# Patient Record
Sex: Female | Born: 1975 | Race: White | Hispanic: No | Smoking: Current every day smoker
Health system: Southern US, Community
[De-identification: ages and names within clinical notes are randomized; demographics above are authoritative.]

## PROBLEM LIST (undated history)

## (undated) DIAGNOSIS — I10 Essential (primary) hypertension: Secondary | ICD-10-CM

## (undated) DIAGNOSIS — G7100 Muscular dystrophy, unspecified: Secondary | ICD-10-CM

## (undated) DIAGNOSIS — F419 Anxiety disorder, unspecified: Secondary | ICD-10-CM

## (undated) DIAGNOSIS — E0590 Thyrotoxicosis, unspecified without thyrotoxic crisis or storm: Secondary | ICD-10-CM

## (undated) HISTORY — PX: OTHER SURGICAL HISTORY: SHX169

---

## 2012-01-24 DIAGNOSIS — E039 Hypothyroidism, unspecified: Secondary | ICD-10-CM | POA: Insufficient documentation

## 2012-01-24 DIAGNOSIS — E669 Obesity, unspecified: Secondary | ICD-10-CM | POA: Insufficient documentation

## 2012-01-24 DIAGNOSIS — M7660 Achilles tendinitis, unspecified leg: Secondary | ICD-10-CM | POA: Insufficient documentation

## 2012-01-24 DIAGNOSIS — R600 Localized edema: Secondary | ICD-10-CM | POA: Insufficient documentation

## 2012-01-24 DIAGNOSIS — G7109 Other specified muscular dystrophies: Secondary | ICD-10-CM | POA: Insufficient documentation

## 2012-01-24 DIAGNOSIS — F411 Generalized anxiety disorder: Secondary | ICD-10-CM | POA: Insufficient documentation

## 2012-02-10 DIAGNOSIS — I10 Essential (primary) hypertension: Secondary | ICD-10-CM | POA: Insufficient documentation

## 2014-10-27 DIAGNOSIS — M79606 Pain in leg, unspecified: Secondary | ICD-10-CM | POA: Insufficient documentation

## 2015-01-12 DIAGNOSIS — Z9181 History of falling: Secondary | ICD-10-CM | POA: Insufficient documentation

## 2016-10-29 DIAGNOSIS — L237 Allergic contact dermatitis due to plants, except food: Secondary | ICD-10-CM | POA: Insufficient documentation

## 2017-03-10 ENCOUNTER — Emergency Department
Admission: EM | Admit: 2017-03-10 | Discharge: 2017-03-11 | Disposition: A | Discharge status: Home / Self Care | Payer: Self-pay | Attending: Emergency Medicine | Admitting: Emergency Medicine

## 2017-03-10 ENCOUNTER — Emergency Department: Payer: Self-pay

## 2017-03-10 ENCOUNTER — Encounter: Payer: Self-pay | Admitting: Emergency Medicine

## 2017-03-10 DIAGNOSIS — F17200 Nicotine dependence, unspecified, uncomplicated: Secondary | ICD-10-CM | POA: Insufficient documentation

## 2017-03-10 DIAGNOSIS — Z880 Allergy status to penicillin: Secondary | ICD-10-CM | POA: Insufficient documentation

## 2017-03-10 DIAGNOSIS — R079 Chest pain, unspecified: Secondary | ICD-10-CM | POA: Insufficient documentation

## 2017-03-10 DIAGNOSIS — F419 Anxiety disorder, unspecified: Secondary | ICD-10-CM | POA: Insufficient documentation

## 2017-03-10 DIAGNOSIS — R0789 Other chest pain: Secondary | ICD-10-CM | POA: Insufficient documentation

## 2017-03-10 DIAGNOSIS — E0590 Thyrotoxicosis, unspecified without thyrotoxic crisis or storm: Secondary | ICD-10-CM | POA: Insufficient documentation

## 2017-03-10 DIAGNOSIS — I4581 Long QT syndrome: Secondary | ICD-10-CM | POA: Insufficient documentation

## 2017-03-10 DIAGNOSIS — I10 Essential (primary) hypertension: Secondary | ICD-10-CM | POA: Insufficient documentation

## 2017-03-10 HISTORY — DX: Thyrotoxicosis, unspecified without thyrotoxic crisis or storm: E05.90

## 2017-03-10 HISTORY — DX: Essential (primary) hypertension: I10

## 2017-03-10 HISTORY — DX: Muscular dystrophy, unspecified: G71.00

## 2017-03-10 HISTORY — DX: Anxiety disorder, unspecified: F41.9

## 2017-03-10 LAB — BASIC METABOLIC PANEL
Anion gap: 16 — ABNORMAL HIGH (ref 5–15)
BUN: 9 mg/dL (ref 6–20)
CO2: 20 mmol/L — ABNORMAL LOW (ref 22–32)
CREATININE: 0.74 mg/dL (ref 0.44–1.00)
Calcium: 10.2 mg/dL (ref 8.9–10.3)
Chloride: 99 mmol/L — ABNORMAL LOW (ref 101–111)
GFR calc non Af Amer: >60 mL/min (ref >60)
Glucose, Bld: 124 mg/dL — ABNORMAL HIGH (ref 65–99)
Potassium: 3.7 mmol/L (ref 3.5–5.1)
SODIUM: 135 mmol/L (ref 135–145)

## 2017-03-10 LAB — CBC
HCT: 42.8 % (ref 35.0–47.0)
Hemoglobin: 14.3 g/dL (ref 12.0–16.0)
MCH: 27.2 pg (ref 26.0–34.0)
MCHC: 33.3 g/dL (ref 32.0–36.0)
MCV: 81.7 fL (ref 80.0–100.0)
PLATELETS: 358 10*3/uL (ref 150–440)
RBC: 5.24 MIL/uL — AB (ref 3.80–5.20)
RDW: 15.4 % — AB (ref 11.5–14.5)
WBC: 14.2 10*3/uL — AB (ref 3.6–11.0)

## 2017-03-10 LAB — TROPONIN I

## 2017-03-10 LAB — FIBRIN DERIVATIVES D-DIMER (ARMC ONLY): FIBRIN DERIVATIVES D-DIMER (ARMC): 668.09 ng{FEU}/mL — AB (ref 0.00–499.00)

## 2017-03-10 LAB — POCT PREGNANCY, URINE: PREG TEST UR: NEGATIVE

## 2017-03-10 MED ORDER — LORAZEPAM 0.5 MG PO TABS: 0.5000 mg | ORAL_TABLET | Freq: Three times a day (TID) | ORAL | 0 refills | Status: AC | PRN

## 2017-03-10 MED ORDER — IOPAMIDOL (ISOVUE-370) INJECTION 76%
100.0000 mL | Freq: Once | INTRAVENOUS | Status: AC | PRN
  Administered 2017-03-10: 100 mL via INTRAVENOUS

## 2017-03-10 MED ORDER — LORAZEPAM 2 MG/ML IJ SOLN
0.5000 mg | Freq: Once | INTRAMUSCULAR | Status: AC
  Administered 2017-03-10: 0.5 mg via INTRAVENOUS
  Filled 2017-03-10: qty 1

## 2017-03-10 NOTE — ED Notes (Signed)
Patient transported to CT 

## 2017-03-10 NOTE — Discharge Instructions (Signed)

## 2017-03-10 NOTE — ED Provider Notes (Signed)
[facility] Emergency Department Provider Note   ____________________________________________   First MD Initiated Contact with Patient 03/10/17 1920     (approximate)  I have reviewed the triage vital signs and the nursing notes.   HISTORY  Chief Complaint Chest Pain    HPI Summer Moore is a 41 y.o. female here for evaluation of chest discomfort  Patient began feeling slightly uncomfortable in her chest today.  She reports she started feeling very anxious, thought she was having a panic attack and began feeling short of breath with tingling and numbness in her hands and tremors.  Felt cramping in her hands.  She reports that she feels very anxious and concerned, but is trying to convince her self she is having a panic attack.  She does have a history of muscular dystrophy and has chronic weakness in her lower legs which is unchanged.  Denies trouble breathing, but does feel short of breath.  No history of any blood clots.  No recent hospitalizations.  No long trips or travel.  No known leg swelling.  No history of DVTs personally or in her family.  She denies history of early onset cardiac disease in her family, no known heart attacks in young family members.  She does smoke occasionally.  No known history of high cholesterol  Past Medical History:  Diagnosis Date  . Anxiety   . Hypertension   . Hyperthyroidism   . Muscular dystrophy     There are no active problems to display for this patient.   Past Surgical History:  Procedure Laterality Date  . achiles lengthening    . heel core lengthening      Prior to Admission medications   Medication Sig Start Date End Date Taking? Authorizing Provider  LORazepam (ATIVAN) 0.5 MG tablet Take 1 tablet (0.5 mg total) by mouth every 8 (eight) hours as needed for anxiety. 03/10/17 03/11/17  Sharyn Creamer, MD    Allergies Erythromycin; Penicillins; and Tetracyclines & related  History reviewed. No  pertinent family history.  Social History Social History  Substance Use Topics  . Smoking status: Current Every Day Smoker  . Smokeless tobacco: Never Used  . Alcohol use Yes    Review of Systems Constitutional: No fever/chills Eyes: No visual changes. ENT: No sore throat. Cardiovascular: Describes a heart to describe feeling of slight tightness across her upper chest Respiratory: See HPI.  No cough, no productive cough, no wheezing. Gastrointestinal: No abdominal pain.  No nausea, no vomiting.  No diarrhea.  No constipation. Genitourinary: Negative for dysuria.  Denies pregnancy. Musculoskeletal: Negative for back pain. Skin: Negative for rash. Neurological: Negative for headaches, focal weakness or numbness.    ____________________________________________   PHYSICAL EXAM:  VITAL SIGNS: ED Triage Vitals  Enc Vitals Group     BP 03/10/17 1731 128/75     Pulse Rate 03/10/17 1731 92     Resp 03/10/17 1731 (!) 22     Temp 03/10/17 1731 98.2 F (36.8 C)     Temp Source 03/10/17 1731 Oral     SpO2 03/10/17 1731 100 %     Weight 03/10/17 1732 260 lb (117.9 kg)     Height 03/10/17 1732 5\' 11"  (1.803 m)     Head Circumference --      Peak Flow --      Pain Score 03/10/17 1731 8     Pain Loc --      Pain Edu? --      Excl.  in GC? --     Constitutional: Alert and oriented.  Anxious and slightly tachypneic, but in no acute distress. Eyes: Conjunctivae are normal. Head: Atraumatic. Nose: No congestion/rhinnorhea. Mouth/Throat: Mucous membranes are moist. Neck: No stridor.   Cardiovascular: Normal rate, regular rhythm. Grossly normal heart sounds.  Good peripheral circulation. Respiratory: Normal respiratory effort except for some slight tachypnea.  No retractions. Lungs CTAB.  Speaks in full and clear sentences. Gastrointestinal: Soft and nontender. No distention. Musculoskeletal: No lower extremity tenderness nor edema.  Has 5 out of 5 strength in the lower extremities,  but seems slightly reduced compared to a normal examination as she reports chronic weakness.  No lower extremity edema.  No venous stasis noted. Neurologic:  Normal speech and language. No gross focal neurologic deficits are appreciated.  Skin:  Skin is warm, dry and intact. No rash noted. Psychiatric: Mood and affect are normal. Speech and behavior are normal.  ____________________________________________   LABS (all labs ordered are listed, but only abnormal results are displayed)  Labs Reviewed  BASIC METABOLIC PANEL - Abnormal; Notable for the following:       Result Value   Chloride 99 (*)    CO2 20 (*)    Glucose, Bld 124 (*)    Anion gap 16 (*)    All other components within normal limits  CBC - Abnormal; Notable for the following:    WBC 14.2 (*)    RBC 5.24 (*)    RDW 15.4 (*)    All other components within normal limits  BLOOD GAS, VENOUS - Abnormal; Notable for the following:    pH, Ven 7.47 (*)    pCO2, Ven 34 (*)    All other components within normal limits  FIBRIN DERIVATIVES D-DIMER ([facility] ONLY) - Abnormal; Notable for the following:    Fibrin derivatives D-dimer ([facility]) 668.09 (*)    All other components within normal limits  TROPONIN I  TROPONIN I  POC URINE PREG, ED  POCT PREGNANCY, URINE   ____________________________________________  EKG  Reviewed and interpreted by me at 1730 Heart rate 95 QRS 75 QTC 540 Sinus rhythm with slightly short PR intervals and prolonged QT ____________________________________________  RADIOLOGY  Dg Chest 2 View  Result Date: 03/10/2017 CLINICAL DATA:  Chest pain.  Shortness of breath. EXAM: CHEST  2 VIEW COMPARISON:  None. FINDINGS: Poor inspiration. Normal sized heart. Mild diffuse peribronchial thickening. Thoracic spine degenerative changes. IMPRESSION: Mild bronchitic changes. Electronically Signed   By: Beckie Salts M.D.   On: 03/10/2017 18:16    Chest x-ray mild bronchitic changes.  Reviewed by me.  Patient  denies any cough or infectious symptoms.  She is a smoker ____________________________________________   PROCEDURES  Procedure(s) performed: None  Procedures  Critical Care performed: No  ____________________________________________   INITIAL IMPRESSION / ASSESSMENT AND PLAN / ED COURSE  Pertinent labs & imaging results that were available during my care of the patient were reviewed by me and considered in my medical decision making (see chart for details).  Patient presents for shortness of breath and chest discomfort.  Very atypical in presentation, based on my initial clinical assessment I find it unlikely to represent acute coronary syndrome.  Her heart score low risk with an initial normal troponin.  She does also note dyspnea and numbness and tingling in both hands and fingers with a history of panic attacks.  Her pulmonary exam is very reassuring with normal oxygen saturation.  No significant risk factors other than smoking for  pulmonary embolism, and she does not complain of any pleuritic chest pain.  No ripping tearing or dissecting type pain.  Chest x-ray reassuring no associated abdominal symptoms  Clinical Course as of Mar 10 2317  Tue Mar 10, 2017  2147 Patient is resting much more comfortably.  Her d-dimer is however slightly elevated, and we will proceed with CT angiography which I discussed with the patient and her sister to evaluate and exclude pulmonary embolism given the elevated d-dimer with associated shortness of breath and chest tightness she experienced.  She reports her chest discomfort is essentially gone, she is no longer short of breath.  Her VBG seem to suggest she may have been hyperventilating, likely also this accounts for her elevated anion gap to a low CO2.   Presently resting comfortably in no distress.  She has requested that she may have an as needed Ativan prescription in the event she was to have similar episodes of panic, she does report that she is  had many other panic attacks in her life but never quite as bad as this.  I will oblige her by giving a brief prescription for Ativan if discharged on a as needed basis.  Discussed no driving tonight or while taking that medication.  [MQ]    Clinical Course User Index [MQ] Sharyn CreamerQuale, Neymar Dowe, MD   ----------------------------------------- 11:17 PM on 03/10/2017 -----------------------------------------  Ongoing care assigned to Dr. Manson PasseyBrown.  Follow-up on CT of the chest to exclude pulmonary embolism, if normal would reevaluate and consider discharge to home.  I have provided a brief as needed Ativan prescription and instructed the patient on safe use.  I would like for her to follow-up with cardiology regarding her symptoms and presentation today with traditional chest pain right preterm precautions, in addition she will need further evaluation of her prolonged QT which may be related to her ssri medications and no baseline EKG is notable.  However she does not complain of any symptomatology which is associated with QT prolongation such as papitations/syncope, etc.   ____________________________________________   FINAL CLINICAL IMPRESSION(S) / ED DIAGNOSES  Final diagnoses:  Atypical chest pain  Chest pain with low risk for cardiac etiology  EKG abnormality (Q-T prolongation present at birth)      NEW MEDICATIONS STARTED DURING THIS VISIT:  New Prescriptions   LORAZEPAM (ATIVAN) 0.5 MG TABLET    Take 1 tablet (0.5 mg total) by mouth every 8 (eight) hours as needed for anxiety.     Note:  This document was prepared using Dragon voice recognition software and may include unintentional dictation errors.     Sharyn CreamerQuale, Izabell Schalk, MD 03/10/17 [phone]

## 2017-03-10 NOTE — ED Notes (Signed)
poct pregnancy Negative 

## 2017-03-10 NOTE — ED Notes (Signed)
Pt moved to room 1  Report off to [place]

## 2017-03-10 NOTE — ED Notes (Signed)
Pt reports chest pain and sob since this morning.  States I thought it was my anxiety, but I took my citalopram and im not any better.   Pt has tremors.  cig smoker.  No cough  No fever.  Hx muscular dystrophy.  Pt alert.

## 2017-03-10 NOTE — ED Triage Notes (Signed)
Pt c/o pain across entire chest that started earlier today.  Feels SHOB as well.  Pain has been constant since this morning.  Describes as heavy. tachypenic shallow breaths noted. Does have anxiety hx. No respiratory distress noted.  Color WNL

## 2017-03-12 LAB — BLOOD GAS, VENOUS
Acid-Base Excess: 1.5 mmol/L (ref 0.0–2.0)
Bicarbonate: 24.7 mmol/L (ref 20.0–28.0)
PCO2 VEN: 34 mmHg — AB (ref 44.0–60.0)
PO2 VEN: UNDETERMINED mmHg (ref 32.0–45.0)
Patient temperature: 37
pH, Ven: 7.47 — ABNORMAL HIGH (ref 7.250–7.430)

## 2017-03-16 DIAGNOSIS — F410 Panic disorder [episodic paroxysmal anxiety] without agoraphobia: Secondary | ICD-10-CM | POA: Insufficient documentation

## 2017-04-22 DIAGNOSIS — R6889 Other general symptoms and signs: Secondary | ICD-10-CM | POA: Insufficient documentation

## 2018-03-29 DIAGNOSIS — R519 Headache, unspecified: Secondary | ICD-10-CM | POA: Insufficient documentation

## 2019-02-28 DIAGNOSIS — R600 Localized edema: Secondary | ICD-10-CM | POA: Insufficient documentation

## 2019-02-28 DIAGNOSIS — R252 Cramp and spasm: Secondary | ICD-10-CM | POA: Insufficient documentation

## 2019-07-02 IMAGING — CT CT ANGIO CHEST
2 of 6 series · 18 of 46 positions shown · IV contrast (APPLIED)
Comparison: Chest radiograph March 10, 2017 at 4616 hours

CLINICAL DATA: Chest pain beginning this morning. Shortness of
breath. History of hypertension, hyperthyroidism, muscular
dystrophy.

EXAM:
CT ANGIOGRAPHY CHEST WITH CONTRAST
TECHNIQUE: Multidetector CT imaging of the chest was performed using the
standard protocol during bolus administration of intravenous
contrast. Multiplanar CT image reconstructions and MIPs were
obtained to evaluate the vascular anatomy.
CONTRAST:  100 cc Isovue 370

[Series 5: thins · axial · 0.81mm/px · z∈[+50,+264]mm · 16 of 236 slices shown]
[im 11/236  lung]
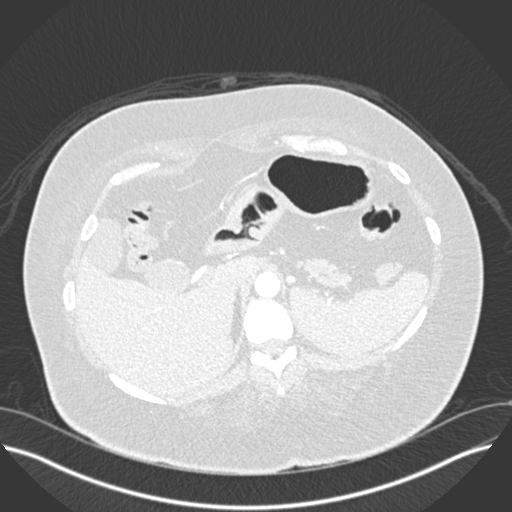
[im 31/236  soft-tissue]
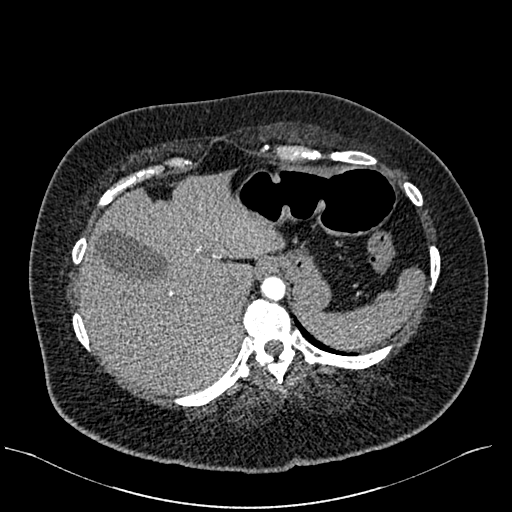
[im 41/236  lung]
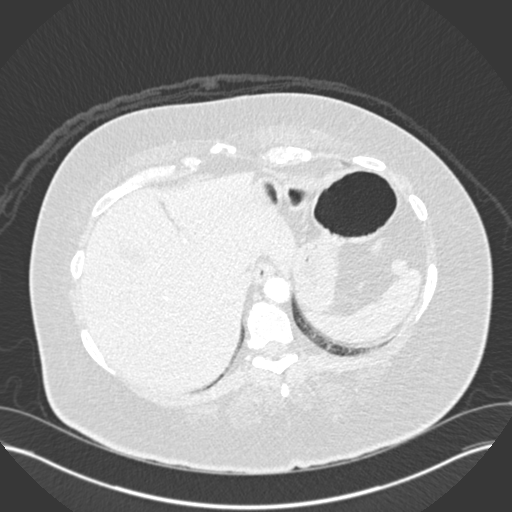
[im 52/236  soft-tissue]
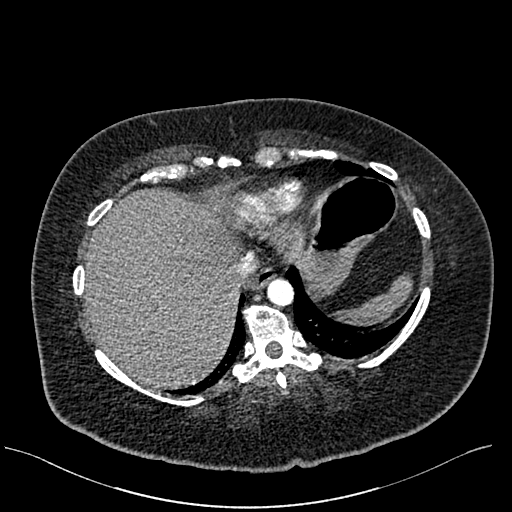
[im 72/236  lung]
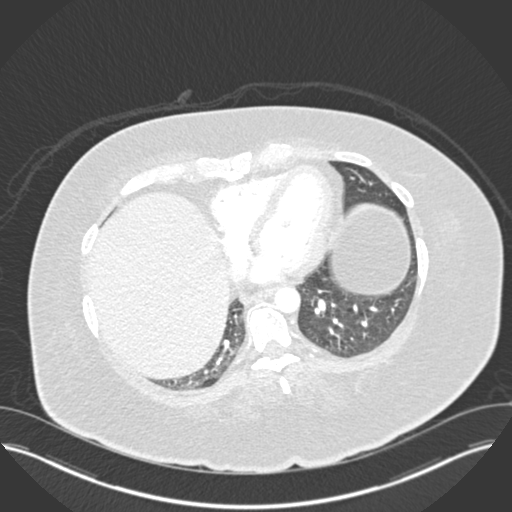
[im 82/236  soft-tissue]
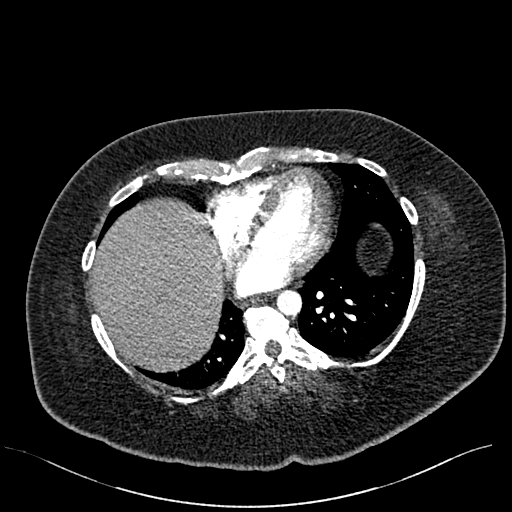
[im 92/236  lung]
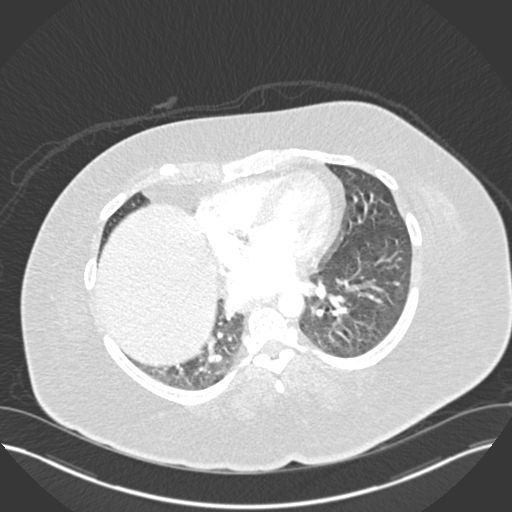
[im 113/236  soft-tissue]
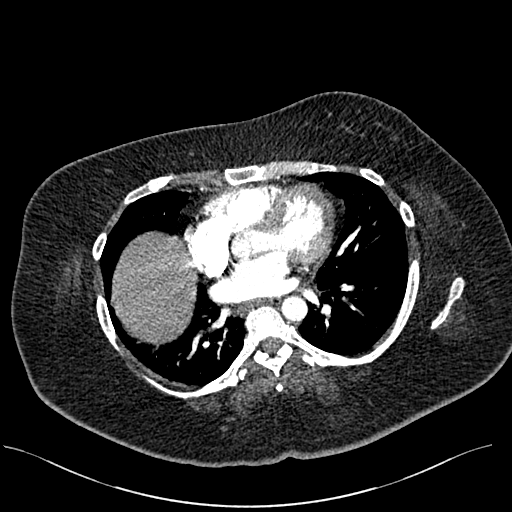
[im 123/236  lung]
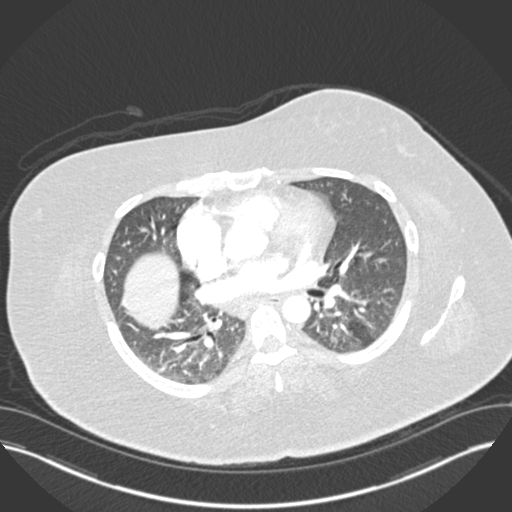
[im 144/236  soft-tissue]
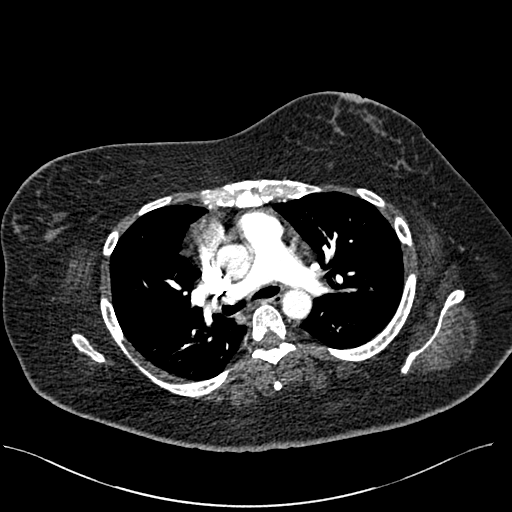
[im 154/236  lung]
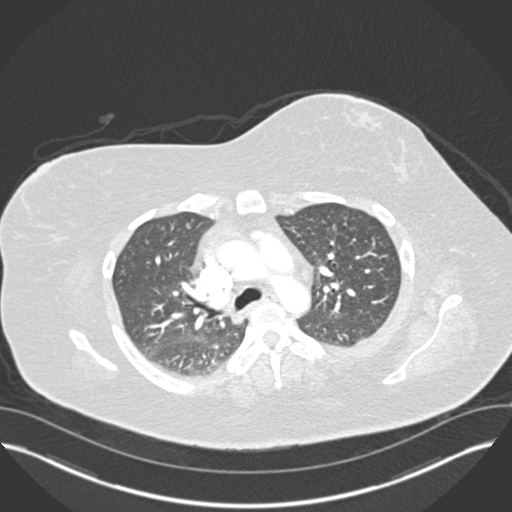
[im 164/236  soft-tissue]
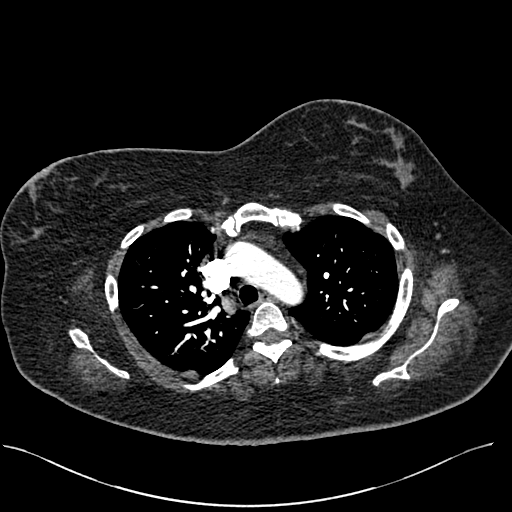
[im 184/236  lung]
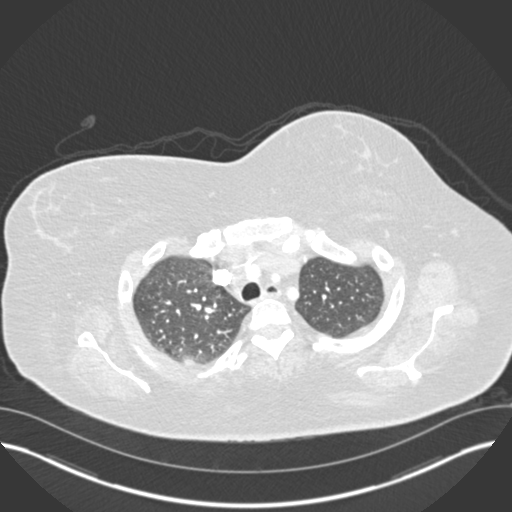
[im 195/236  soft-tissue]
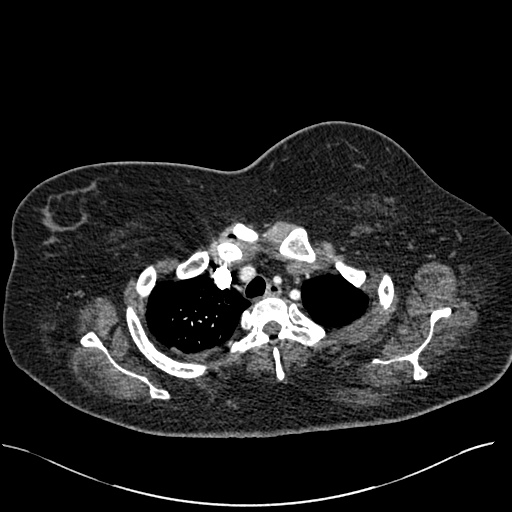
[im 205/236  lung]
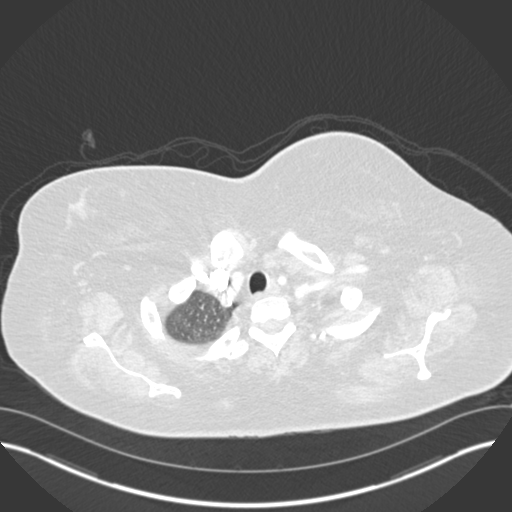
[im 225/236  soft-tissue]
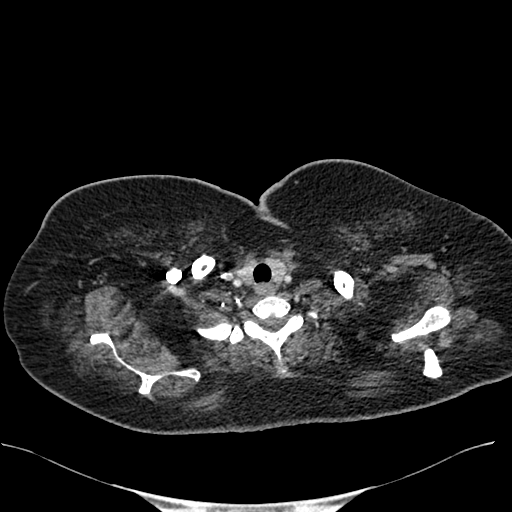

[Series 7: coronal mpr · coronal · 0.49mm/px · 2 of 92 slices shown]
[im 31/92  soft-tissue]
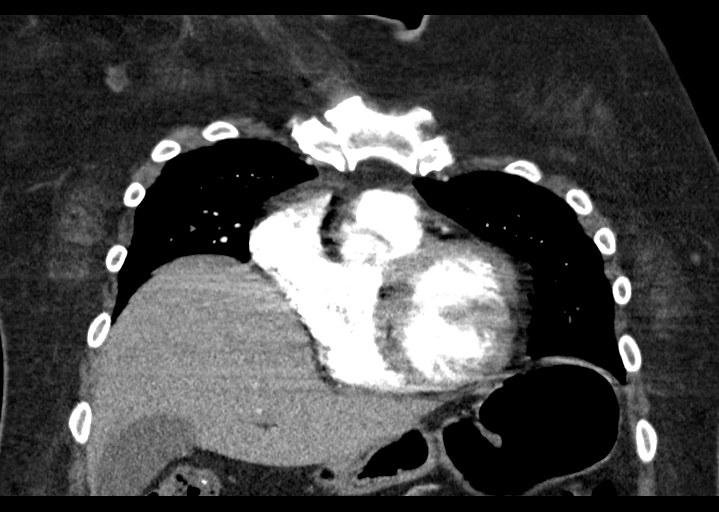
[im 61/92  soft-tissue]
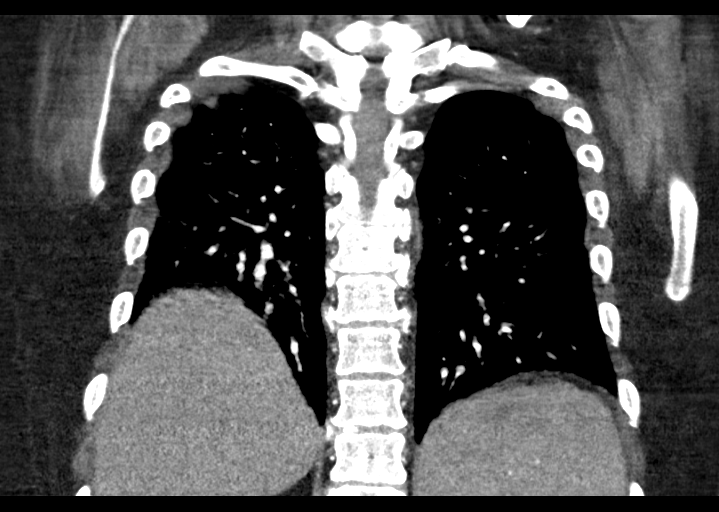

[18 of 46 positions shown; findings below may reference images not displayed]

FINDINGS: CARDIOVASCULAR: Adequate contrast opacification of the pulmonary
artery's. Main pulmonary artery is not enlarged. No pulmonary
arterial filling defects to the level of the subsegmental branches.
Heart size is normal, no right heart strain. No pericardial
effusion. Thoracic aorta is normal course and caliber, unremarkable.

MEDIASTINUM/NODES: No lymphadenopathy by CT size criteria.

LUNGS/PLEURA: Low lung volumes. Tracheobronchial tree is patent, no
pneumothorax. No pleural effusions, focal consolidations, pulmonary
nodules or masses. RIGHT lower lobe atelectasis.

UPPER ABDOMEN: Included view of the abdomen is unremarkable.

MUSCULOSKELETAL: Symmetric muscle atrophy and fatty infiltration
consistent with history of muscular dystrophy. Mild degenerative
change of thoracic spine. No acute osseous process.

Review of the MIP images confirms the above findings.
IMPRESSION: 1. No acute pulmonary embolism.
2. No acute cardiopulmonary process in this low inspiratory
examination

## 2020-01-05 DIAGNOSIS — Z833 Family history of diabetes mellitus: Secondary | ICD-10-CM | POA: Insufficient documentation

## 2020-01-19 DIAGNOSIS — F17200 Nicotine dependence, unspecified, uncomplicated: Secondary | ICD-10-CM | POA: Insufficient documentation

## 2020-01-19 DIAGNOSIS — M545 Low back pain, unspecified: Secondary | ICD-10-CM | POA: Insufficient documentation

## 2020-01-19 DIAGNOSIS — S5001XA Contusion of right elbow, initial encounter: Secondary | ICD-10-CM | POA: Insufficient documentation

## 2020-09-11 DIAGNOSIS — S90121A Contusion of right lesser toe(s) without damage to nail, initial encounter: Secondary | ICD-10-CM | POA: Insufficient documentation

## 2020-11-08 DIAGNOSIS — M25552 Pain in left hip: Secondary | ICD-10-CM | POA: Insufficient documentation

## 2022-02-05 DIAGNOSIS — G7100 Muscular dystrophy, unspecified: Secondary | ICD-10-CM | POA: Insufficient documentation

## 2022-06-26 DIAGNOSIS — F411 Generalized anxiety disorder: Secondary | ICD-10-CM | POA: Insufficient documentation

## 2022-11-04 DIAGNOSIS — M25512 Pain in left shoulder: Secondary | ICD-10-CM | POA: Insufficient documentation

## 2023-10-01 DIAGNOSIS — G894 Chronic pain syndrome: Secondary | ICD-10-CM | POA: Insufficient documentation

## 2023-10-01 DIAGNOSIS — Z79899 Other long term (current) drug therapy: Secondary | ICD-10-CM | POA: Insufficient documentation

## 2023-10-01 DIAGNOSIS — M899 Disorder of bone, unspecified: Secondary | ICD-10-CM | POA: Insufficient documentation

## 2023-10-01 DIAGNOSIS — Z79891 Long term (current) use of opiate analgesic: Secondary | ICD-10-CM | POA: Insufficient documentation

## 2023-10-01 DIAGNOSIS — Z789 Other specified health status: Secondary | ICD-10-CM | POA: Insufficient documentation

## 2023-10-01 NOTE — Progress Notes (Signed)
 Department: [facility] Date: 10/05/2023  Encounter Type: Initial interventional pain management evaluation. Event: NO SHOW.  Reason: None communicated.

## 2023-10-01 NOTE — Patient Instructions (Signed)

## 2023-10-05 ENCOUNTER — Ambulatory Visit (HOSPITAL_BASED_OUTPATIENT_CLINIC_OR_DEPARTMENT_OTHER): Admitting: Pain Medicine

## 2023-10-05 DIAGNOSIS — Z79891 Long term (current) use of opiate analgesic: Secondary | ICD-10-CM

## 2023-10-05 DIAGNOSIS — G8929 Other chronic pain: Secondary | ICD-10-CM

## 2023-10-05 DIAGNOSIS — Z79899 Other long term (current) drug therapy: Secondary | ICD-10-CM

## 2023-10-05 DIAGNOSIS — G894 Chronic pain syndrome: Secondary | ICD-10-CM

## 2023-10-05 DIAGNOSIS — Z789 Other specified health status: Secondary | ICD-10-CM

## 2023-10-05 DIAGNOSIS — M899 Disorder of bone, unspecified: Secondary | ICD-10-CM

## 2023-10-05 DIAGNOSIS — Z91199 Patient's noncompliance with other medical treatment and regimen due to unspecified reason: Secondary | ICD-10-CM
